# Patient Record
Sex: Female | Born: 1965 | Race: Black or African American | Hispanic: No | Marital: Single | Smoking: Current every day smoker
Health system: Southern US, Community
[De-identification: ages and names within clinical notes are randomized; demographics above are authoritative.]

## PROBLEM LIST (undated history)

## (undated) DIAGNOSIS — J45909 Unspecified asthma, uncomplicated: Secondary | ICD-10-CM

## (undated) DIAGNOSIS — I639 Cerebral infarction, unspecified: Secondary | ICD-10-CM

## (undated) DIAGNOSIS — I10 Essential (primary) hypertension: Secondary | ICD-10-CM

## (undated) DIAGNOSIS — E119 Type 2 diabetes mellitus without complications: Secondary | ICD-10-CM

## (undated) HISTORY — PX: ABDOMINAL HYSTERECTOMY: SHX81

---

## 2016-06-02 ENCOUNTER — Emergency Department (HOSPITAL_BASED_OUTPATIENT_CLINIC_OR_DEPARTMENT_OTHER): Payer: Self-pay

## 2016-06-02 ENCOUNTER — Encounter (HOSPITAL_BASED_OUTPATIENT_CLINIC_OR_DEPARTMENT_OTHER): Payer: Self-pay | Admitting: Emergency Medicine

## 2016-06-02 ENCOUNTER — Emergency Department (HOSPITAL_BASED_OUTPATIENT_CLINIC_OR_DEPARTMENT_OTHER)
Admission: EM | Admit: 2016-06-02 | Discharge: 2016-06-02 | Disposition: A | Discharge status: Home / Self Care | Payer: Self-pay | Attending: Emergency Medicine | Admitting: Emergency Medicine

## 2016-06-02 DIAGNOSIS — Z7984 Long term (current) use of oral hypoglycemic drugs: Secondary | ICD-10-CM | POA: Insufficient documentation

## 2016-06-02 DIAGNOSIS — E119 Type 2 diabetes mellitus without complications: Secondary | ICD-10-CM | POA: Insufficient documentation

## 2016-06-02 DIAGNOSIS — I10 Essential (primary) hypertension: Secondary | ICD-10-CM | POA: Insufficient documentation

## 2016-06-02 DIAGNOSIS — G44209 Tension-type headache, unspecified, not intractable: Secondary | ICD-10-CM | POA: Insufficient documentation

## 2016-06-02 DIAGNOSIS — F17200 Nicotine dependence, unspecified, uncomplicated: Secondary | ICD-10-CM | POA: Insufficient documentation

## 2016-06-02 DIAGNOSIS — J45909 Unspecified asthma, uncomplicated: Secondary | ICD-10-CM | POA: Insufficient documentation

## 2016-06-02 DIAGNOSIS — Z79899 Other long term (current) drug therapy: Secondary | ICD-10-CM | POA: Insufficient documentation

## 2016-06-02 HISTORY — DX: Cerebral infarction, unspecified: I63.9

## 2016-06-02 HISTORY — DX: Type 2 diabetes mellitus without complications: E11.9

## 2016-06-02 HISTORY — DX: Unspecified asthma, uncomplicated: J45.909

## 2016-06-02 HISTORY — DX: Essential (primary) hypertension: I10

## 2016-06-02 LAB — CBC WITH DIFFERENTIAL/PLATELET
Basophils Absolute: 0 10*3/uL (ref 0.0–0.1)
Basophils Relative: 0 %
EOS ABS: 0.1 10*3/uL (ref 0.0–0.7)
EOS PCT: 1 %
HCT: 38.3 % (ref 36.0–46.0)
HEMOGLOBIN: 13.1 g/dL (ref 12.0–15.0)
LYMPHS PCT: 31 %
Lymphs Abs: 1.9 10*3/uL (ref 0.7–4.0)
MCH: 29.7 pg (ref 26.0–34.0)
MCHC: 34.2 g/dL (ref 30.0–36.0)
MCV: 86.8 fL (ref 78.0–100.0)
MONOS PCT: 7 %
Monocytes Absolute: 0.5 10*3/uL (ref 0.1–1.0)
Neutro Abs: 3.7 10*3/uL (ref 1.7–7.7)
Neutrophils Relative %: 61 %
Platelets: 174 10*3/uL (ref 150–400)
RBC: 4.41 MIL/uL (ref 3.87–5.11)
RDW: 12.2 % (ref 11.5–15.5)
WBC: 6.1 10*3/uL (ref 4.0–10.5)

## 2016-06-02 LAB — BASIC METABOLIC PANEL
Anion gap: 6 (ref 5–15)
BUN: 20 mg/dL (ref 6–20)
CO2: 28 mmol/L (ref 22–32)
CREATININE: 0.91 mg/dL (ref 0.44–1.00)
Calcium: 9 mg/dL (ref 8.9–10.3)
Chloride: 104 mmol/L (ref 101–111)
GFR calc Af Amer: >60 mL/min (ref >60)
GFR calc non Af Amer: >60 mL/min (ref >60)
Glucose, Bld: 243 mg/dL — ABNORMAL HIGH (ref 65–99)
POTASSIUM: 3.7 mmol/L (ref 3.5–5.1)
Sodium: 138 mmol/L (ref 135–145)

## 2016-06-02 MED ORDER — KETOROLAC TROMETHAMINE 30 MG/ML IJ SOLN
30.0000 mg | Freq: Once | INTRAMUSCULAR | Status: AC
  Administered 2016-06-02: 30 mg via INTRAVENOUS

## 2016-06-02 MED ORDER — MORPHINE SULFATE (PF) 2 MG/ML IV SOLN
2.0000 mg | Freq: Once | INTRAVENOUS | Status: AC
  Administered 2016-06-02: 2 mg via INTRAVENOUS
  Filled 2016-06-02: qty 1

## 2016-06-02 MED ORDER — KETOROLAC TROMETHAMINE 60 MG/2ML IM SOLN
60.0000 mg | Freq: Once | INTRAMUSCULAR | Status: DC
  Filled 2016-06-02: qty 2

## 2016-06-02 MED ORDER — TRAMADOL HCL 50 MG PO TABS: 50.0000 mg | ORAL_TABLET | Freq: Four times a day (QID) | ORAL | 0 refills | Status: DC | PRN

## 2016-06-02 MED ORDER — IOPAMIDOL (ISOVUE-370) INJECTION 76%
100.0000 mL | Freq: Once | INTRAVENOUS | Status: AC | PRN
  Administered 2016-06-02: 100 mL via INTRAVENOUS

## 2016-06-02 NOTE — ED Provider Notes (Signed)
[facility] Provider Note   CSN: [id number] Arrival date & time: 06/02/16  1449     History   Chief Complaint Chief Complaint  Patient presents with  . Headache    HPI Kayla Gutierrez is a 51 y.o. female.  Patient is a 51 year old female who presents with a headache. 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 She states this was about 2 years ago and resulted in a coil being placed. She states that this morning about 6:00 she woke up and when she was lying in bed noticed a pop to her upper neck and this resulted in a sudden headache to the back of her head. Spin progressively worsening throughout the day. She denies any nausea or vomiting. She does have some photophobia. She has not taken anything for the pain. She has some pain going down her neck but not further down her spine. She denies any numbness or weakness to her extremities. No facial numbness or speech deficits. No vision changes.      Past Medical History:  Diagnosis Date  . Asthma   . Diabetes mellitus without complication (HCC)   . Hypertension   . Stroke [facility](HCC)     There are no active problems to display for this patient.   Past Surgical History:  Procedure Laterality Date  . ABDOMINAL HYSTERECTOMY    . CESAREAN SECTION      OB History    No data available       Home Medications    Prior to Admission medications   Medication Sig Start Date End Date Taking? Authorizing Provider  AMLODIPINE BESYLATE PO Take 10 mg by mouth.   Yes Historical Provider, MD  metFORMIN (GLUMETZA) 1000 MG (MOD) 24 hr tablet Take 1,000 mg by mouth daily with breakfast.   Yes Historical Provider, MD  nitroGLYCERIN (NITROSTAT) 0.4 MG SL tablet Place 0.4 mg under the tongue every 5 (five) minutes as needed for chest pain.   Yes Historical Provider, MD  pantoprazole (PROTONIX) 40 MG tablet Take 40 mg by mouth daily.   Yes Historical Provider, MD  traMADol  (ULTRAM) 50 MG tablet Take 1 tablet (50 mg total) by mouth every 6 (six) hours as needed. 06/02/16   Rolan BuccoMelanie Maelle Sheaffer, MD    Family History History reviewed. No pertinent family history.  Social History Social History  Substance Use Topics  . Smoking status: Current Every Day Smoker  . Smokeless tobacco: Never Used  . Alcohol use No     Allergies   Penicillins   Review of Systems Review of Systems  Constitutional: Negative for chills, diaphoresis, fatigue and fever.  HENT: Negative for congestion, rhinorrhea and sneezing.   Eyes: Negative.   Respiratory: Negative for cough, chest tightness and shortness of breath.   Cardiovascular: Negative for chest pain and leg swelling.  Gastrointestinal: Negative for abdominal pain, blood in stool, diarrhea, nausea and vomiting.  Genitourinary: Negative for difficulty urinating, flank pain, frequency and hematuria.  Musculoskeletal: Positive for neck pain. Negative for arthralgias and back pain.  Skin: Negative for rash.  Neurological: Positive for headaches. Negative for dizziness, speech difficulty, weakness and numbness.     Physical Exam Updated Vital Signs BP 149/95 (BP Location: Left Arm)   Pulse 80   Temp 99.4 F (37.4 C) (Oral)   Resp 20   Ht 5' (1.524 m)   Wt 267 lb (121.1 kg)   SpO2 98%   BMI 52.14 kg/m  Physical Exam  Constitutional: She is oriented to person, place, and time. She appears well-developed and well-nourished.  HENT:  Head: Normocephalic and atraumatic.  Eyes: Pupils are equal, round, and reactive to light.  Neck: Normal range of motion. Neck supple.  She does have some tenderness to the base of the occiput bilaterally  Cardiovascular: Normal rate, regular rhythm and normal heart sounds.   Pulmonary/Chest: Effort normal and breath sounds normal. No respiratory distress. She has no wheezes. She has no rales. She exhibits no tenderness.  Abdominal: Soft. Bowel sounds are normal. There is no tenderness.  There is no rebound and no guarding.  Musculoskeletal: Normal range of motion. She exhibits no edema.  Lymphadenopathy:    She has no cervical adenopathy.  Neurological: She is alert and oriented to person, place, and time.  Motor 5/5 all extremities Sensation grossly intact to LT all extremities Finger to Nose intact, no pronator drift CN II-XII grossly intact    Skin: Skin is warm and dry. No rash noted.  Psychiatric: She has a normal mood and affect.     ED Treatments / Results  Labs (all labs ordered are listed, but only abnormal results are displayed) Labs Reviewed  BASIC METABOLIC PANEL - Abnormal; Notable for the following:       Result Value   Glucose, Bld 243 (*)    All other components within normal limits  CBC WITH DIFFERENTIAL/PLATELET    EKG  EKG Interpretation None       Radiology Ct Angio Head W/cm &/or Wo Cm  Result Date: 06/02/2016 CLINICAL DATA:  Sudden onset of headache. Severe posterior headache, worse when lying flat. Symptoms began this morning. 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 EXAM: CT ANGIOGRAPHY HEAD TECHNIQUE: Multidetector CT imaging of the head was performed using the standard protocol during bolus administration of intravenous contrast. Multiplanar CT image reconstructions and MIPs were obtained to evaluate the vascular anatomy. CONTRAST:  100 mL Isovue 370 COMPARISON:  CT a of the head and neck 12/05/2014 at [facility]. FINDINGS: CT HEAD Brain: Aneurysm coils are again noted. There is expected evolution of the anterior inferior right frontal lobe infarct. No acute infarct, hemorrhage, or mass lesion is present. The basal ganglia are intact. Insular ribbon is normal. The brainstem and cerebellum are within normal limits. Vascular: Right ICA terminus aneurysm coil pack. No significant calcifications or hyperdense vessel. Skull: The calvarium is within normal limits. No focal lytic or blastic  lesions are present. Sinuses: The paranasal sinuses and mastoid air cells are clear. Orbits: The globes and orbits are within normal limits. CTA HEAD Anterior circulation: The internal carotid arteries are within normal limits from the high cervical segments through the ICA termini bilaterally. There is artifact from the coil pack on the right. The A1 and M1 segments are otherwise unremarkable. The MCA bifurcation is intact bilaterally. ACA and MCA branch vessels are within normal limits. Posterior circulation: The left vertebral artery is the dominant vessel. PICA origins are below the field of view. The basilar artery is normal. The posterior cerebral arteries originate from the basilar tip. A prominent right posterior communicating artery is present as well. Moderate attenuation of the proximal right P2 segment is noted. Left-sided PCA branch vessels are unremarkable. Venous sinuses: The dural sinuses are patent. The left transverse sinus is dominant. The straight sinus and deep cerebral veins are intact. Cortical veins are unremarkable. Anatomic variants: Prominent right posterior communicating artery Delayed phase: The postcontrast  images demonstrate no pathologic enhancement. IMPRESSION: 1. No acute infarct or hemorrhage. 2. Expected evolution of the anterior inferior right frontal lobe infarct. 3. Stable right ICA terminus coil pack. 4. 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 Narrowing is better visualized on today's study. The proximal right PCA was obscured by metal artifact on the prior exam. Electronically Signed   By: Marin Roberts M.D.   On: 06/02/2016 17:36    Procedures Procedures (including critical care time)  Medications Ordered in ED Medications  morphine 2 MG/ML injection 2 mg (2 mg Intravenous Given 06/02/16 1621)  iopamidol (ISOVUE-370) 76 % injection 100 mL (100 mLs Intravenous Contrast Given 06/02/16 1659)  ketorolac  (TORADOL) 30 MG/ML injection 30 mg (30 mg Intravenous Given 06/02/16 1758)     Initial Impression / Assessment and Plan / ED Course  I have reviewed the triage vital signs and the nursing notes.  Pertinent labs & imaging results that were available during my care of the patient were reviewed by me and considered in my medical decision making (see chart for details).     Patient presents with headache. Given her history of aneurysm, she did have a CT/CTA which showed no evidence of intracranial hemorrhage. No evidence of aneurysm. No other acute abnormalities. I feel that her pain could be related to musculoskeletal neck pain as she is tender at the base of her occiput. She is neurologically intact. Her symptoms have improved in the ED. She was discharged home in good condition. She was given prescriptions for tramadol. She was encouraged to have close follow-up with her PCP. Return precautions were given.  Final Clinical Impressions(s) / ED Diagnoses   Final diagnoses:  Acute non intractable tension-type headache    New Prescriptions New Prescriptions   TRAMADOL (ULTRAM) 50 MG TABLET    Take 1 tablet (50 mg total) by mouth every 6 (six) hours as needed.     Rolan Bucco, MD 06/02/16 [phone]

## 2016-06-02 NOTE — ED Triage Notes (Signed)
Pt states she woke up this morning around 6am heard a popping sound and started having a HA

## 2016-06-02 NOTE — ED Notes (Signed)
Pt requested something to drink; RN explained NPO status until test results complete.

## 2018-10-15 IMAGING — CT CT ANGIO HEAD
3 of 11 series · 14 of 47 positions shown · IV contrast (APPLIED)
Comparison: CT a of the head and neck 12/05/2014 at [REDACTED].

CLINICAL DATA: Sudden onset of headache. Severe posterior headache,
worse when lying flat. Symptoms began this morning. Personal history
of right ICA terminus aneurysm treated with coiling.

EXAM:
CT ANGIOGRAPHY HEAD
TECHNIQUE: Multidetector CT imaging of the head was performed using the
standard protocol during bolus administration of intravenous
contrast. Multiplanar CT image reconstructions and MIPs were
obtained to evaluate the vascular anatomy.
CONTRAST:  100 mL Isovue 370

[Series 10: ax thin · axial · 0.39mm/px · z∈[-152,-39]mm · 8 of 144 slices shown]
[im 15/144  brain]
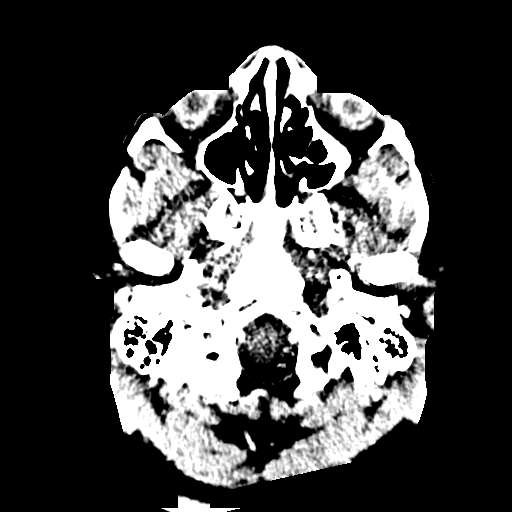
[im 29/144  bone]
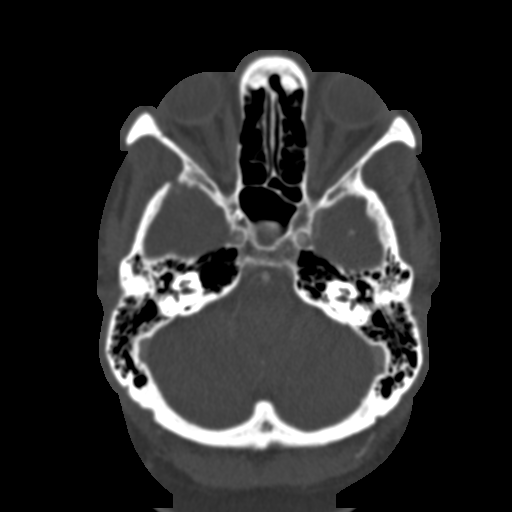
[im 43/144  brain]
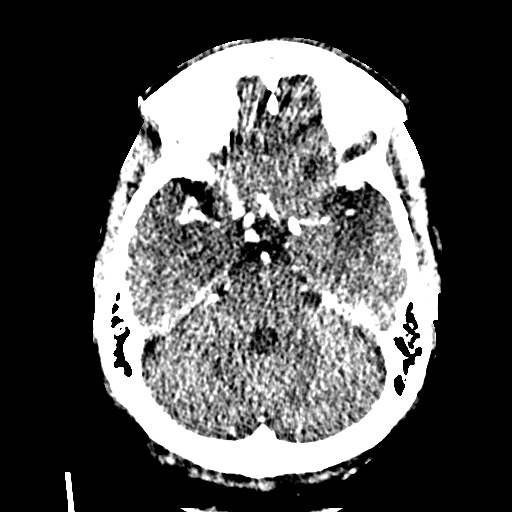
[im 58/144  bone]
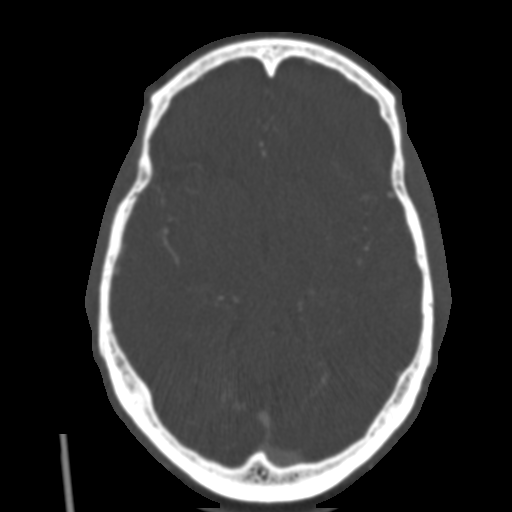
[im 86/144  brain]
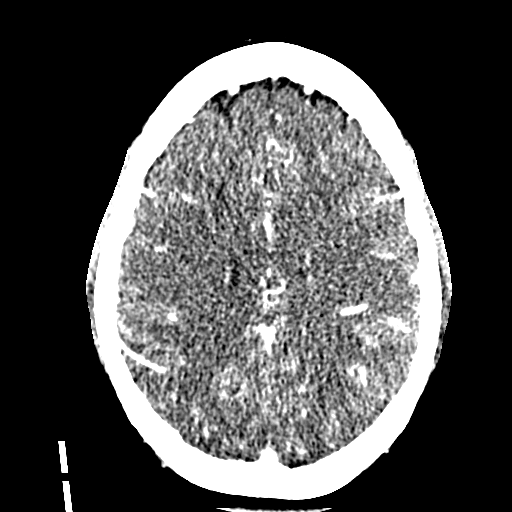
[im 101/144  bone]
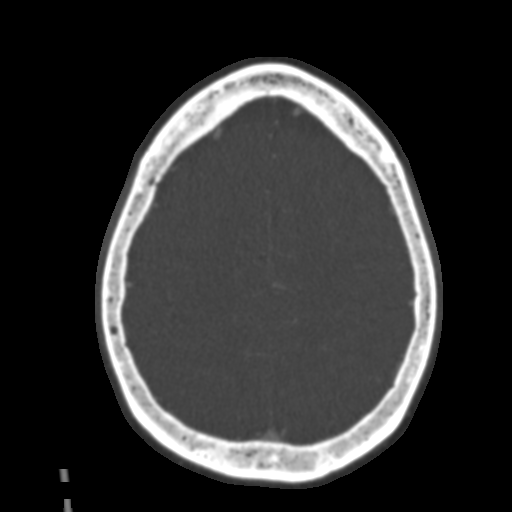
[im 115/144  brain]
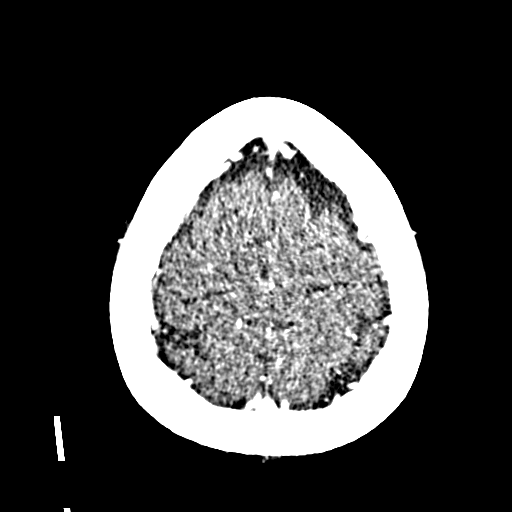
[im 129/144  bone]
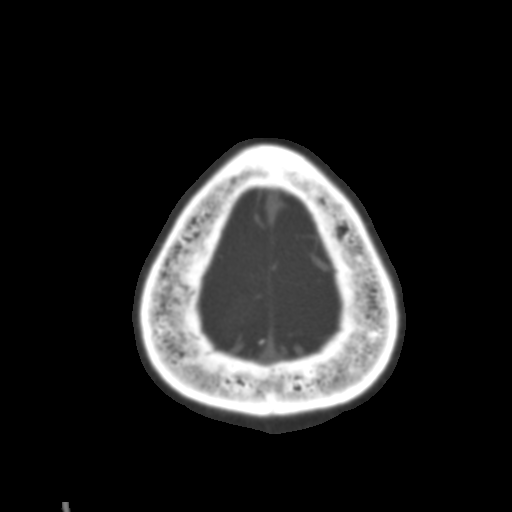

[Series 12: cor thin · coronal · 0.30mm/px · 3 of 195 slices shown]
[im 56/195  brain]
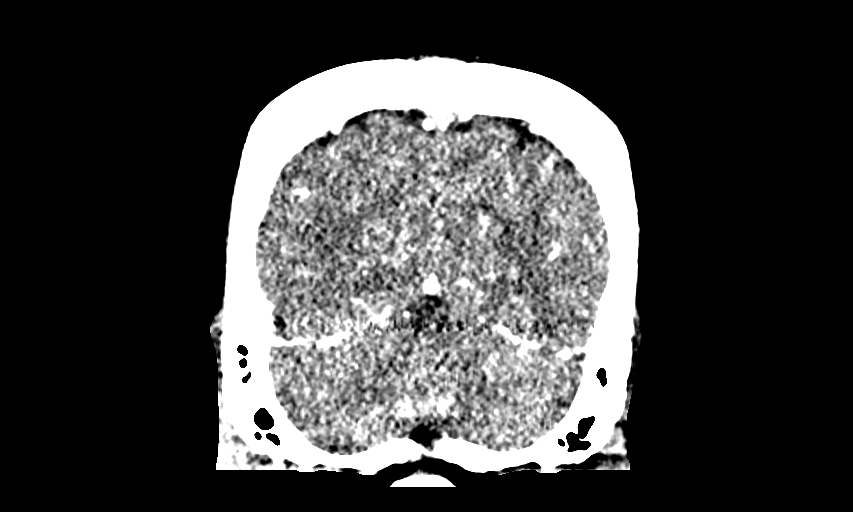
[im 84/195  brain]
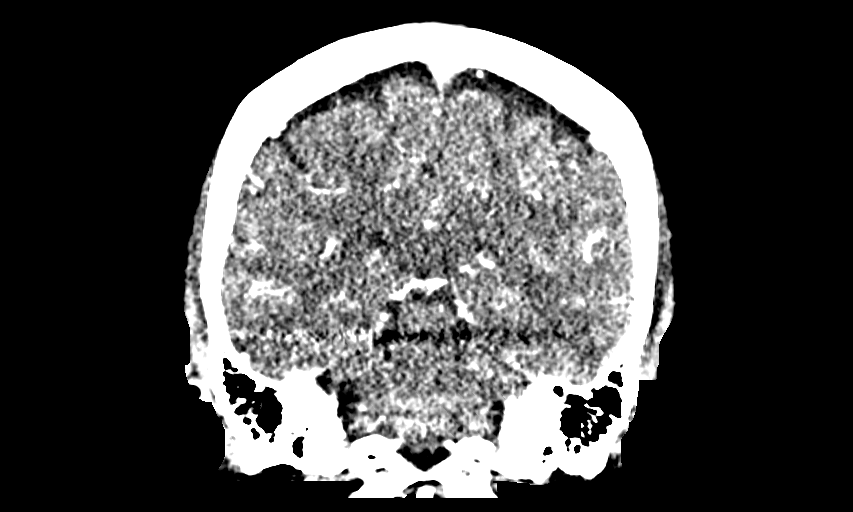
[im 111/195  brain]
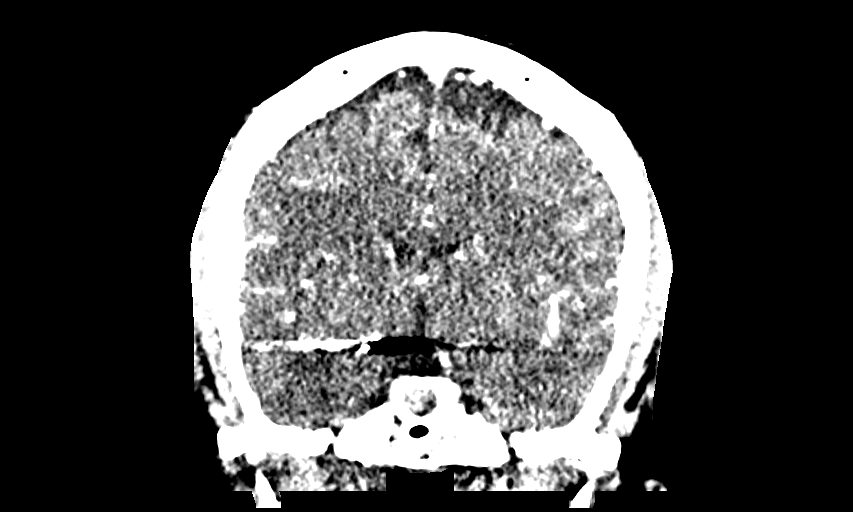

[Series 14: sag thin · sagittal · 0.30mm/px · 3 of 165 slices shown]
[im 33/165  brain]
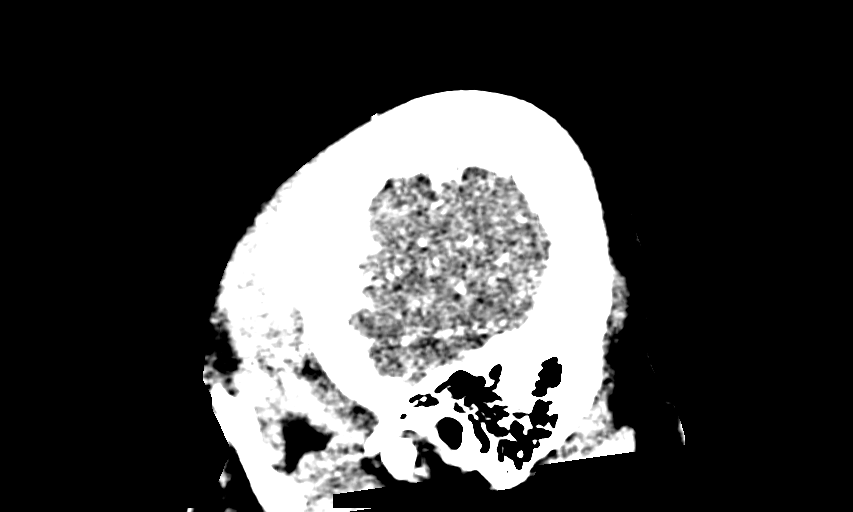
[im 66/165  brain]
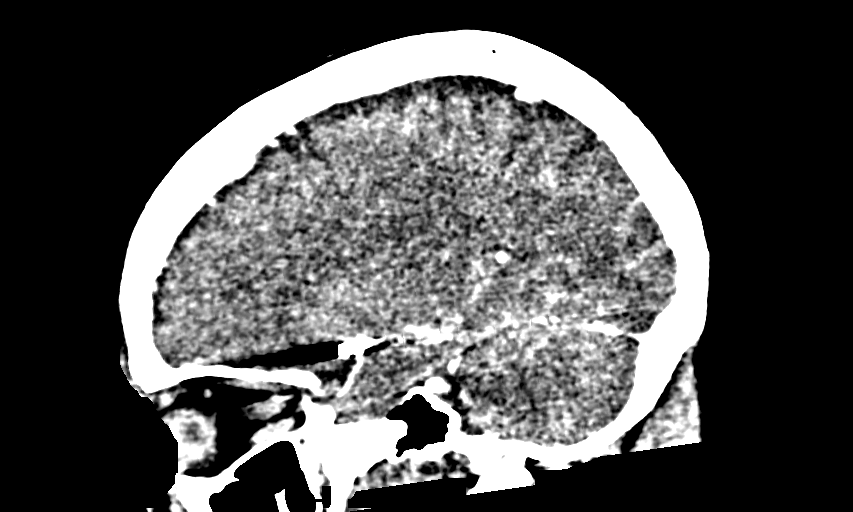
[im 99/165  brain]
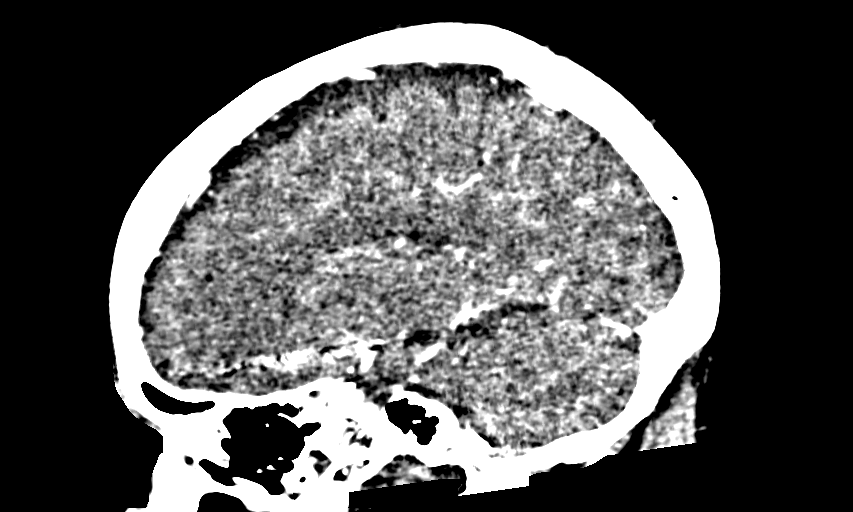

[14 of 47 positions shown; findings below may reference images not displayed]

FINDINGS: CT HEAD

Brain: Aneurysm coils are again noted. There is expected evolution
of the anterior inferior right frontal lobe infarct. No acute
infarct, hemorrhage, or mass lesion is present. The basal ganglia
are intact. Insular ribbon is normal. The brainstem and cerebellum
are within normal limits.

Vascular: Right ICA terminus aneurysm coil pack. No significant
calcifications or hyperdense vessel.

Skull: The calvarium is within normal limits. No focal lytic or
blastic lesions are present.

Sinuses: The paranasal sinuses and mastoid air cells are clear.

Orbits: The globes and orbits are within normal limits.

CTA HEAD

Anterior circulation: The internal carotid arteries are within
normal limits from the high cervical segments through the ICA
termini bilaterally. There is artifact from the coil pack on the
right. The A1 and M1 segments are otherwise unremarkable. The MCA
bifurcation is intact bilaterally. ACA and MCA branch vessels are
within normal limits.

Posterior circulation: The left vertebral artery is the dominant
vessel. PICA origins are below the field of view. The basilar artery
is normal. The posterior cerebral arteries originate from the
basilar tip. A prominent right posterior communicating artery is
present as well. Moderate attenuation of the proximal right P2
segment is noted. Left-sided PCA branch vessels are unremarkable.

Venous sinuses: The dural sinuses are patent. The left transverse
sinus is dominant. The straight sinus and deep cerebral veins are
intact. Cortical veins are unremarkable.

Anatomic variants: Prominent right posterior communicating artery

Delayed phase: The postcontrast images demonstrate no pathologic
enhancement.
IMPRESSION: 1. No acute infarct or hemorrhage.
2. Expected evolution of the anterior inferior right frontal lobe
infarct.
3. Stable right ICA terminus coil pack.
4. Moderate stenosis of the proximal right posterior cerebral artery
is similar to the prior exam. Narrowing is better visualized on
today's study. The proximal right PCA was obscured by metal artifact
on the prior exam.

## 2022-10-18 ENCOUNTER — Encounter (HOSPITAL_BASED_OUTPATIENT_CLINIC_OR_DEPARTMENT_OTHER): Payer: Self-pay | Admitting: Emergency Medicine

## 2022-10-18 ENCOUNTER — Other Ambulatory Visit: Payer: Self-pay

## 2022-10-18 ENCOUNTER — Emergency Department (HOSPITAL_BASED_OUTPATIENT_CLINIC_OR_DEPARTMENT_OTHER)
Admission: EM | Admit: 2022-10-18 | Discharge: 2022-10-18 | Disposition: A | Discharge status: Home / Self Care | Payer: 59 | Attending: Emergency Medicine | Admitting: Emergency Medicine

## 2022-10-18 DIAGNOSIS — M545 Low back pain, unspecified: Secondary | ICD-10-CM | POA: Insufficient documentation

## 2022-10-18 DIAGNOSIS — Z7984 Long term (current) use of oral hypoglycemic drugs: Secondary | ICD-10-CM | POA: Insufficient documentation

## 2022-10-18 MED ORDER — KETOROLAC TROMETHAMINE 15 MG/ML IJ SOLN
15.0000 mg | Freq: Once | INTRAMUSCULAR | Status: AC
  Administered 2022-10-18: 15 mg via INTRAMUSCULAR
  Filled 2022-10-18: qty 1

## 2022-10-18 MED ORDER — LIDOCAINE 5 % EX PTCH
1.0000 | MEDICATED_PATCH | CUTANEOUS | Status: DC
  Administered 2022-10-18: 1 via TRANSDERMAL
  Filled 2022-10-18: qty 1

## 2022-10-18 MED ORDER — LIDOCAINE 5 % EX PTCH: 1.0000 | MEDICATED_PATCH | CUTANEOUS | 0 refills | Status: DC

## 2022-10-18 NOTE — ED Provider Notes (Signed)
Kayla Gutierrez EMERGENCY DEPARTMENT AT [facility] Provider Note   CSN: [id number] Arrival date & time: 10/18/22  1146     History Chief Complaint  Patient presents with   Back Pain    Kayla Gutierrez is a 57 y.o. 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  Patient states she was working with the patient and he began to lose his balance and she tried loaned to the wheelchair and had a twisting motion felt in her lower back.  Since then she has been having left lower back pain.  Pain is nonradiating and she denies bowel and bladder incontinence, weakness or numbness to her lower extremities.   Back Pain      Home Medications Prior to Admission medications   Medication Sig Start Date End Date Taking? Authorizing Provider  lidocaine (LIDODERM) 5 % Place 1 patch onto the skin daily. Remove & Discard patch within 12 hours or as directed by MD 10/18/22  Yes Meredeth Ide, Kaidence Callaway M, PA-C  AMLODIPINE BESYLATE PO Take 10 mg by mouth.    [provider]  metFORMIN (GLUMETZA) 1000 MG (MOD) 24 hr tablet Take 1,000 mg by mouth daily with breakfast.    [provider]  nitroGLYCERIN (NITROSTAT) 0.4 MG SL tablet Place 0.4 mg under the tongue every 5 (five) minutes as needed for chest pain.    [provider]  pantoprazole (PROTONIX) 40 MG tablet Take 40 mg by mouth daily.    [provider]  traMADol (ULTRAM) 50 MG tablet Take 1 tablet (50 mg total) by mouth every 6 (six) hours as needed. 06/02/16   Rolan Bucco, MD      Allergies    Penicillins    Review of Systems   Review of Systems  Musculoskeletal:  Positive for back pain.  All other systems reviewed and are negative.   Physical Exam Updated Vital Signs BP (!) 189/121 (BP Location: Left Wrist)   Pulse 90   Temp 98.1 F (36.7 C) (Oral)   Resp 20   Ht 5' (1.524 m)   Wt  119.3 kg   SpO2 98%   BMI 51.36 kg/m  Physical Exam Vitals and nursing note reviewed.  Constitutional:      Appearance: Normal appearance.  HENT:     Head: Normocephalic and atraumatic.  Eyes:     General:        Right eye: No discharge.        Left eye: No discharge.     Conjunctiva/sclera: Conjunctivae normal.  Pulmonary:     Effort: Pulmonary effort is normal.  Musculoskeletal:     Comments: 5/5 strength to the lower extremities.  Normal sensation to the lower extremities.  There is no midline tenderness of the thoracic or lumbar spine.  There is paralumbar muscular tenderness.  Skin:    General: Skin is warm and dry.     Findings: No rash.  Neurological:     General: No focal deficit present.     Mental Status: She is alert.  Psychiatric:        Mood and Affect: Mood normal.        Behavior: Behavior normal.     ED Results / Procedures / Treatments   Labs (all labs ordered are listed, but only abnormal results are displayed) Labs Reviewed - No data to display  EKG None  Radiology No  results found.  Procedures Procedures    Medications Ordered in ED Medications  lidocaine (LIDODERM) 5 % 1 patch (1 patch Transdermal Patch Applied 10/18/22 1232)  ketorolac (TORADOL) 15 MG/ML injection 15 mg (15 mg Intramuscular Given 10/18/22 1231)    ED Course/ Medical Decision Making/ A&P Clinical Course as of 10/18/22 1255  Thu Oct 18, 2022  1252 On reevaluation, patient states she is feeling better and wishes to go home.  This is reasonable. [CF]    Clinical Course User Index [CF] Teressa Lower, PA-C   {   Click here for ABCD2, HEART and other calculators  Medical Decision Making Kayla Gutierrez is a 57 y.o. female patient who presents to the emergency department today for further evaluation of left lower back pain.  This is likely musculoskeletal spasm.  I have a low suspicion for any cauda equina syndrome or any deep-seated infection.  Patient is diabetic I would  likely hold off on steroids at this time.  I will plan to give her Toradol and a Lidoderm patch and plan to reassess.  I do not feel imaging is warranted at this time.  Patient is feeling better. Strict return precautions given, she is safe for discharge. Will treat conservatively with anti-inflammatories.   Risk Prescription drug management.   ED Diagnoses Final diagnoses:  Acute left-sided low back pain without sciatica    Rx / DC Orders ED Discharge Orders          Ordered    lidocaine (LIDODERM) 5 %  Every 24 hours        10/18/22 1252              Honor Loh [place], [place] 10/18/22 1255    Alvira Monday, MD 10/18/22 2230

## 2022-10-18 NOTE — ED Triage Notes (Signed)
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 Denies any urinary symptoms.

## 2022-10-18 NOTE — Discharge Instructions (Signed)
I would continue taking 600 mg of ibuprofen as needed every 6 hours for back pain.  You can take this up to 7 days.  Have also prescribed you with the lidocaine patches.  If these are helpful you can get them over-the-counter or use the prescription.  Whichever is cheaper.  I would like for you to schedule an appointment with your primary care doctor if you are not feeling any better.  You may return to the emergency department for any worsening symptoms.

## 2023-02-11 ENCOUNTER — Encounter (HOSPITAL_BASED_OUTPATIENT_CLINIC_OR_DEPARTMENT_OTHER): Payer: Self-pay | Admitting: Urology

## 2023-02-11 ENCOUNTER — Emergency Department (HOSPITAL_BASED_OUTPATIENT_CLINIC_OR_DEPARTMENT_OTHER)
Admission: EM | Admit: 2023-02-11 | Discharge: 2023-02-11 | Disposition: A | Discharge status: Home / Self Care | Payer: 59 | Attending: Emergency Medicine | Admitting: Emergency Medicine

## 2023-02-11 DIAGNOSIS — Z794 Long term (current) use of insulin: Secondary | ICD-10-CM | POA: Insufficient documentation

## 2023-02-11 DIAGNOSIS — E1065 Type 1 diabetes mellitus with hyperglycemia: Secondary | ICD-10-CM | POA: Insufficient documentation

## 2023-02-11 DIAGNOSIS — Z20822 Contact with and (suspected) exposure to covid-19: Secondary | ICD-10-CM | POA: Diagnosis not present

## 2023-02-11 DIAGNOSIS — E1165 Type 2 diabetes mellitus with hyperglycemia: Secondary | ICD-10-CM | POA: Diagnosis not present

## 2023-02-11 DIAGNOSIS — I10 Essential (primary) hypertension: Secondary | ICD-10-CM | POA: Diagnosis not present

## 2023-02-11 DIAGNOSIS — R519 Headache, unspecified: Secondary | ICD-10-CM | POA: Diagnosis not present

## 2023-02-11 DIAGNOSIS — Z79899 Other long term (current) drug therapy: Secondary | ICD-10-CM | POA: Insufficient documentation

## 2023-02-11 DIAGNOSIS — Z7984 Long term (current) use of oral hypoglycemic drugs: Secondary | ICD-10-CM | POA: Diagnosis not present

## 2023-02-11 DIAGNOSIS — K047 Periapical abscess without sinus: Secondary | ICD-10-CM | POA: Insufficient documentation

## 2023-02-11 DIAGNOSIS — R739 Hyperglycemia, unspecified: Secondary | ICD-10-CM

## 2023-02-11 LAB — CBC WITH DIFFERENTIAL/PLATELET
Abs Immature Granulocytes: 0.01 10*3/uL (ref 0.00–0.07)
Basophils Absolute: 0 10*3/uL (ref 0.0–0.1)
Basophils Relative: 0 %
Eosinophils Absolute: 0.1 10*3/uL (ref 0.0–0.5)
Eosinophils Relative: 2 %
HCT: 38.4 % (ref 36.0–46.0)
Hemoglobin: 13.3 g/dL (ref 12.0–15.0)
Immature Granulocytes: 0 %
Lymphocytes Relative: 33 %
Lymphs Abs: 1.9 10*3/uL (ref 0.7–4.0)
MCH: 29 pg (ref 26.0–34.0)
MCHC: 34.6 g/dL (ref 30.0–36.0)
MCV: 83.7 fL (ref 80.0–100.0)
Monocytes Absolute: 0.4 10*3/uL (ref 0.1–1.0)
Monocytes Relative: 7 %
Neutro Abs: 3.3 10*3/uL (ref 1.7–7.7)
Neutrophils Relative %: 58 %
Platelets: 205 10*3/uL (ref 150–400)
RBC: 4.59 MIL/uL (ref 3.87–5.11)
RDW: 11.9 % (ref 11.5–15.5)
WBC: 5.8 10*3/uL (ref 4.0–10.5)
nRBC: 0 % (ref 0.0–0.2)

## 2023-02-11 LAB — COMPREHENSIVE METABOLIC PANEL
ALT: 16 U/L (ref 0–44)
AST: 23 U/L (ref 15–41)
Albumin: 3.2 g/dL — ABNORMAL LOW (ref 3.5–5.0)
Alkaline Phosphatase: 119 U/L (ref 38–126)
Anion gap: 9 (ref 5–15)
BUN: 15 mg/dL (ref 6–20)
CO2: 28 mmol/L (ref 22–32)
Calcium: 8.5 mg/dL — ABNORMAL LOW (ref 8.9–10.3)
Chloride: 93 mmol/L — ABNORMAL LOW (ref 98–111)
Creatinine, Ser: 1.24 mg/dL — ABNORMAL HIGH (ref 0.44–1.00)
GFR, Estimated: 51 mL/min — ABNORMAL LOW (ref >60)
Glucose, Bld: 606 mg/dL (ref 70–99)
Potassium: 3.8 mmol/L (ref 3.5–5.1)
Sodium: 130 mmol/L — ABNORMAL LOW (ref 135–145)
Total Bilirubin: 1 mg/dL (ref <1.2)
Total Protein: 7.2 g/dL (ref 6.5–8.1)

## 2023-02-11 LAB — URINALYSIS, ROUTINE W REFLEX MICROSCOPIC
Bilirubin Urine: NEGATIVE
Glucose, UA: >=500 mg/dL — AB
Hgb urine dipstick: NEGATIVE
Ketones, ur: NEGATIVE mg/dL
Leukocytes,Ua: NEGATIVE
Nitrite: NEGATIVE
Protein, ur: NEGATIVE mg/dL
Specific Gravity, Urine: <=1.005 (ref 1.005–1.030)
pH: 5.5 (ref 5.0–8.0)

## 2023-02-11 LAB — RESP PANEL BY RT-PCR (RSV, FLU A&B, COVID)  RVPGX2
Influenza A by PCR: NEGATIVE
Influenza B by PCR: NEGATIVE
Resp Syncytial Virus by PCR: NEGATIVE
SARS Coronavirus 2 by RT PCR: NEGATIVE

## 2023-02-11 LAB — CBG MONITORING, ED
Glucose-Capillary: >600 mg/dL (ref 70–99)
Glucose-Capillary: 536 mg/dL (ref 70–99)
Glucose-Capillary: 363 mg/dL — ABNORMAL HIGH (ref 70–99)

## 2023-02-11 LAB — URINALYSIS, MICROSCOPIC (REFLEX): Bacteria, UA: NONE SEEN

## 2023-02-11 LAB — TROPONIN I (HIGH SENSITIVITY): Troponin I (High Sensitivity): 4 ng/L (ref <18)

## 2023-02-11 MED ORDER — SODIUM CHLORIDE 0.9 % IV BOLUS
1000.0000 mL | Freq: Once | INTRAVENOUS | Status: AC
  Administered 2023-02-11: 1000 mL via INTRAVENOUS

## 2023-02-11 MED ORDER — AMLODIPINE BESYLATE 5 MG PO TABS
10.0000 mg | ORAL_TABLET | Freq: Once | ORAL | Status: AC
  Administered 2023-02-11: 10 mg via ORAL
  Filled 2023-02-11: qty 2

## 2023-02-11 MED ORDER — HYDROCHLOROTHIAZIDE 25 MG PO TABS
25.0000 mg | ORAL_TABLET | Freq: Once | ORAL | Status: AC
  Administered 2023-02-11: 25 mg via ORAL
  Filled 2023-02-11: qty 1

## 2023-02-11 MED ORDER — AMLODIPINE BESYLATE 10 MG PO TABS: 10.0000 mg | ORAL_TABLET | Freq: Every day | ORAL | 2 refills | Status: DC

## 2023-02-11 MED ORDER — METFORMIN HCL 500 MG PO TABS: 500.0000 mg | ORAL_TABLET | Freq: Two times a day (BID) | ORAL | 2 refills | Status: DC

## 2023-02-11 MED ORDER — INSULIN ASPART 100 UNIT/ML IJ SOLN
10.0000 [IU] | Freq: Once | INTRAMUSCULAR | Status: AC
  Administered 2023-02-11: 10 [IU] via SUBCUTANEOUS

## 2023-02-11 MED ORDER — AMOXICILLIN 500 MG PO CAPS: 500.0000 mg | ORAL_CAPSULE | Freq: Three times a day (TID) | ORAL | 0 refills | Status: DC

## 2023-02-11 MED ORDER — HYDROCHLOROTHIAZIDE 25 MG PO TABS: 25.0000 mg | ORAL_TABLET | Freq: Every day | ORAL | 2 refills | Status: DC

## 2023-02-11 NOTE — ED Notes (Addendum)
error 

## 2023-02-11 NOTE — ED Notes (Signed)
..  The patient is A&OX4, ambulatory at d/c with independent steady gait, NAD. Pt verbalized understanding of d/c instructions, prescriptions and follow up care.  

## 2023-02-11 NOTE — ED Notes (Signed)
CBG >600 in triage, pt hypertensive as well  Provider made aware

## 2023-02-11 NOTE — ED Notes (Addendum)
During blood pressure checks, the normal BP has been on the patient's wrist and very loose, like it is falling off. This RN attempted to reposition the cuff for better accuracy but the patient tenses up and yells out in pain. She reports the cuff gets too tight. I attempted to use large BP cuff to left upper arm but she said it was too tight and demanded this RN to remove the cuff. Then this RN attempted to use normal BP cuff and applied it to her forearm and again she said it was too tight, tensed up and then ripped the cuff off. Then, this RN used the small BP to to her left wrist, pt was able to tolerate that better but she was still very tense. This RN then took the regular sized BP and put it on her right forearm/AC but again became very tense. This RN unsure about the accuracy of the pts BP due to her being tense and also the position of the cuff. Josh, PA informed of my observations and concerns.

## 2023-02-11 NOTE — Discharge Instructions (Addendum)
Please read and follow all provided instructions.  Your diagnoses today include:  1. Hyperglycemia   2. Uncontrolled hypertension   3. Dental infection     Tests performed today include: Complete blood cell count:  Basic metabolic panel: Blood sugar was high but no other complications noted Urinalysis (urine test):  Cardiac enzyme blood test for stress on the heart: Was normal Vital signs. See below for your results today.   Medications prescribed:  Amlodipine and HCTZ for blood pressure  Metformin for high blood sugar  Take any prescribed medications only as directed.  Home care instructions:  Follow any educational materials contained in this packet.  BE VERY CAREFUL not to take multiple medicines containing Tylenol (also called acetaminophen). Doing so can lead to an overdose which can damage your liver and cause liver failure and possibly death.   Follow-up instructions: Please follow-up with your primary care provider in the next 3 days for further evaluation of your symptoms.   Return instructions:  Please return to the Emergency Department if you experience worsening symptoms.  Please return if you have any other emergent concerns.  Additional Information:  Your vital signs today were: BP (!) 183/120   Pulse 79   Temp 97.8 F (36.6 C)   Resp 18   Ht 5' (1.524 m)   Wt 119.3 kg   SpO2 98%   BMI 51.37 kg/m  If your blood pressure (BP) was elevated above 135/85 this visit, please have this repeated by your doctor within one month. --------------    Thank you for the opportunity to take care of you in our Emergency Department. You have been diagnosed with high blood pressure, also known as hypertension. This means that the force of blood against the walls of your blood vessels called is too strong. It also means that your heart has to work harder to move the blood. High blood pressure usually has no symptoms, but over time, it can cause serious health problems  such as Heart attack and heart failure Stroke Kidney disease and failure Vision loss With the help from your healthcare provider and some important life style changes, you can manage your blood pressure and protect your health. Please read the instructions provided on hypertension, how to manage it and how to check your blood pressure. Additionally, use the blood pressure log provided to record your blood pressures. Take the blood pressure log with you to your primary care doctor so that they can adjust your blood pressure medications if needed. Please read the instructions on follow-up appointment. Return to the ER or Call 911 right away if you have any of these symptoms: Chest pain or shortness of breath Severe headache Weakness, tingling, or numbness of your face, arms, or legs (especially on 1 side of the body) Sudden change in vision Confusion, trouble speaking, or trouble understanding speech

## 2023-02-11 NOTE — ED Triage Notes (Signed)
Body aches that started Friday, denies fever States face, back, and axilla pain  Vomited Friday and Saturday but now resolved   Ran out of BP meds  Thursday, no pcp at this time to refill

## 2023-02-11 NOTE — ED Provider Notes (Signed)
[facility] [facility] Provider Note   CSN: [id number] Arrival date & time: 02/11/23  1428     History  Chief Complaint  Patient presents with   Generalized Body Aches   Hyperglycemia    Kayla Gutierrez is a 57 y.o. female.  Patient with history of hypertension, diabetes on insulin, high cholesterol, stroke --presents to the emergency department for complaints of generalized not feeling well.  Symptoms have been present for about 3 days.  She reports vomiting during this time, but no vomiting yesterday or today.  She has been out of her medications for blood sugar and blood pressure.  She complains of some facial pain and congestion starting today.  She has also had some body pain in her back and right axilla area without swelling noted.  She reports increased thirst and urination.  No dysuria.  No abdominal pains.  She reports difficulties obtaining medication and PCP follow-up.       Home Medications Prior to Admission medications   Medication Sig Start Date End Date Taking? Authorizing Provider  amitriptyline (ELAVIL) 25 MG tablet Take by mouth. 02/11/20  Yes [provider]  amLODipine (NORVASC) 10 MG tablet Take 1 tablet by mouth daily. 08/19/15  Yes [provider]  atorvastatin (LIPITOR) 10 MG tablet Take by mouth. 03/03/15  Yes [provider]  hydrochlorothiazide (HYDRODIURIL) 12.5 MG tablet Take 1 tablet by mouth daily. 03/24/20  Yes [provider]  Insulin Glargine (BASAGLAR KWIKPEN) 100 UNIT/ML Inject into the skin. 06/01/22  Yes [provider]  metFORMIN (GLUCOPHAGE-XR) 750 MG 24 hr tablet Take by mouth. 06/07/22  Yes [provider]  acetaminophen (TYLENOL) 500 MG tablet Take by mouth.    [provider]  AMLODIPINE BESYLATE PO Take 10 mg by mouth.    [provider]  lidocaine (LIDODERM) 5 % Place 1 patch onto the skin daily. Remove & Discard patch within 12 hours or  as directed by MD 10/18/22   Teressa Lower, PA-C  metFORMIN (GLUMETZA) 1000 MG (MOD) 24 hr tablet Take 1,000 mg by mouth daily with breakfast.    [provider]  nitroGLYCERIN (NITROSTAT) 0.4 MG SL tablet Place 0.4 mg under the tongue every 5 (five) minutes as needed for chest pain.    [provider]  pantoprazole (PROTONIX) 40 MG tablet Take 40 mg by mouth daily.    [provider]  traMADol (ULTRAM) 50 MG tablet Take 1 tablet (50 mg total) by mouth every 6 (six) hours as needed. 06/02/16   Rolan Bucco, MD      Allergies    Penicillins    Review of Systems   Review of Systems  Physical Exam Updated Vital Signs BP (!) 203/106 (BP Location: Left Arm)   Pulse 92   Temp 97.8 F (36.6 C)   Resp 18   Ht 5' (1.524 m)   Wt 119.3 kg   SpO2 100%   BMI 51.37 kg/m   Physical Exam Vitals and nursing note reviewed.  Constitutional:      Appearance: She is well-developed. She is not diaphoretic.  HENT:     Head: Normocephalic and atraumatic.     Right Ear: External ear normal.     Left Ear: External ear normal.     Mouth/Throat:     Mouth: Mucous membranes are dry.     Comments: Trace swelling noted over the left mandibular gums and jaw area consistent with dental infection. Eyes:  Conjunctiva/sclera: Conjunctivae normal.  Neck:     Vascular: Normal carotid pulses. No JVD.     Trachea: Trachea normal. No tracheal deviation.  Cardiovascular:     Rate and Rhythm: Normal rate and regular rhythm.     Pulses: No decreased pulses.          Radial pulses are 2+ on the right side and 2+ on the left side.     Heart sounds: Normal heart sounds, S1 normal and S2 normal. No murmur heard. Pulmonary:     Effort: Pulmonary effort is normal. No respiratory distress.     Breath sounds: No wheezing.  Chest:     Chest wall: No tenderness.  Abdominal:     General: Bowel sounds are normal.     Palpations: Abdomen is soft.     Tenderness: There is no abdominal  tenderness. There is no guarding or rebound.  Musculoskeletal:        General: Normal range of motion.     Cervical back: Normal range of motion and neck supple. No muscular tenderness.  Skin:    General: Skin is warm and dry.     Coloration: Skin is not pale.     Comments: No right axillary tenderness or swelling.  No obvious abscess.  Neurological:     Mental Status: She is alert.     ED Results / Procedures / Treatments   Labs (all labs ordered are listed, but only abnormal results are displayed) Labs Reviewed  COMPREHENSIVE METABOLIC PANEL - Abnormal; Notable for the following components:      Result Value   Sodium 130 (*)    Chloride 93 (*)    Glucose, Bld 606 (*)    Creatinine, Ser 1.24 (*)    Calcium 8.5 (*)    Albumin 3.2 (*)    GFR, Estimated 51 (*)    All other components within normal limits  URINALYSIS, ROUTINE W REFLEX MICROSCOPIC - Abnormal; Notable for the following components:   Glucose, UA >=500 (*)    All other components within normal limits  CBG MONITORING, ED - Abnormal; Notable for the following components:   Glucose-Capillary >600 (*)    All other components within normal limits  CBG MONITORING, ED - Abnormal; Notable for the following components:   Glucose-Capillary 536 (*)    All other components within normal limits  CBG MONITORING, ED - Abnormal; Notable for the following components:   Glucose-Capillary 363 (*)    All other components within normal limits  RESP PANEL BY RT-PCR (RSV, FLU A&B, COVID)  RVPGX2  CBC WITH DIFFERENTIAL/PLATELET  URINALYSIS, MICROSCOPIC (REFLEX)  TROPONIN I (HIGH SENSITIVITY)    EKG None  Radiology No results found.  Procedures Procedures    Medications Ordered in ED Medications  sodium chloride 0.9 % bolus 1,000 mL (0 mLs Intravenous Stopped 02/11/23 1621)  insulin aspart (novoLOG) injection 10 Units (10 Units Subcutaneous Given 02/11/23 1508)  amLODipine (NORVASC) tablet 10 mg (10 mg Oral Given 02/11/23  1506)  hydrochlorothiazide (HYDRODIURIL) tablet 25 mg (25 mg Oral Given 02/11/23 1506)  sodium chloride 0.9 % bolus 1,000 mL (1,000 mLs Intravenous New Bag/Given 02/11/23 1621)    ED Course/ Medical Decision Making/ A&P    Patient seen and examined. History obtained directly from patient.   Labs/EKG: Ordered CBC, CMP, UA, EKG, troponin.  Imaging: None ordered  Medications/Fluids: Ordered: IV fluid.   Most recent vital signs reviewed and are as follows: BP (!) 203/106 (BP Location: Left Arm)  Pulse 92   Temp 97.8 F (36.6 C)   Resp 18   Ht 5' (1.524 m)   Wt 119.3 kg   SpO2 100%   BMI 51.37 kg/m   Initial impression: Hyperglycemia, hypertension, vague body aches.  Right axilla does not have signs of infection or abscess.  For this reason, given risk factors, will check troponin and EKG.  6:16 PM Reassessment performed. Patient appears stable.  She has received 2 L IV fluids.  Blood sugar improving.  Labs personally reviewed and interpreted including: CBC unremarkable; CMP with elevated glucose of 66, sodium 130 correcting to normal; 3.8 potassium, creatinine 1.24 with normal BUN; UA without compelling signs of infection or ketones; normal.  Imaging personally visualized and interpreted including:  Reviewed pertinent lab work and imaging with patient at bedside. Questions answered.   Most current vital signs reviewed and are as follows: BP (!) 183/120   Pulse 79   Temp 97.8 F (36.6 C)   Resp 18   Ht 5' (1.524 m)   Wt 119.3 kg   SpO2 98%   BMI 51.37 kg/m   Plan: Discharge to home.   Prescriptions written for: Amlodipine, HCTZ, metformin, amoxicillin  Other home care instructions discussed: Continue monitoring of blood sugars, maintain good hydration.  ED return instructions discussed: Return with new or worsening symptoms  Follow-up instructions discussed: Patient encouraged to follow-up with their PCP in 3 days.   I have placed [facility] and the [facility] care  management referrals for follow-up on patient's chronic conditions.                                   Medical Decision Making Amount and/or Complexity of Data Reviewed Labs: ordered.  Risk OTC drugs. Prescription drug management.   Hyperglycemia: No evidence of DKA, did not medication noncompliance.  Possibly exacerbated by minor dental infection.  Patient improved in ED with IV fluids.  Subcutaneous insulin.  TOC consult requested as patient does not have reliable PCP follow-up and access to medications.  Uncontrolled hypertension: Exacerbated by medication noncompliance and not having her medications.  No evidence of endorgan damage today.  No sign of stroke, vision changes, heart failure, dissection, significant acute kidney injury.  Minor dental infection: Patient has pain and left lower jaw swelling.  Placed on amoxicillin.        Final Clinical Impression(s) / ED Diagnoses Final diagnoses:  Hyperglycemia  Uncontrolled hypertension  Dental infection    Rx / DC Orders ED Discharge Orders          Ordered    amLODipine (NORVASC) 10 MG tablet  Daily        02/11/23 1813    hydrochlorothiazide (HYDRODIURIL) 25 MG tablet  Daily        02/11/23 1813    metFORMIN (GLUCOPHAGE) 500 MG tablet  2 times daily with meals        02/11/23 1813    Referral to [facility] Care Management       Comments: Pharmacy Medication Management for Uncontrolled hypertension in the ED   02/11/23 1814    amoxicillin (AMOXIL) 500 MG capsule  3 times daily        02/11/23 1817              Renne Crigler, PA-C 02/11/23 1821    Sloan Leiter, DO 02/13/23 [phone]

## 2023-02-12 ENCOUNTER — Telehealth: Payer: Self-pay

## 2023-02-12 NOTE — Progress Notes (Unsigned)
   Care Guide Note  02/12/2023 Name: Logen Babiak MRN: [id number] DOB: 11-Mar-1966  Referred by: Pcp, No Reason for referral : Care Coordination (Outreach to schedule with Pharm d )   Anniemae Lauff is a 57 y.o. year old female who is a primary care patient of Pcp, No. Harlo Ervine was referred to the pharmacist for assistance related to HTN.    An unsuccessful telephone outreach was attempted today to contact the patient who was referred to the pharmacy team for assistance with medication management. Additional attempts will be made to contact the patient.   Penne Lash , RMA     [facility]  [facility], [facility] Guide  Direct Dial: [phone]  Website: [email or web address]

## 2023-02-14 NOTE — Progress Notes (Signed)
   Care Guide Note  02/14/2023 Name: Kayla Gutierrez MRN: [id number] DOB: 06-02-1965  Referred by: Pcp, No Reason for referral : Care Coordination (Outreach to schedule with Pharm d )   Kayla Gutierrez is a 57 y.o. year old female who is a primary care patient of Pcp, No. Alline Rotan was referred to the pharmacist for assistance related to HTN.    Successful contact was made with the patient to discuss pharmacy services including being ready for the pharmacist to call at least 5 minutes before the scheduled appointment time, to have medication bottles and any blood sugar or blood pressure readings ready for review. The patient agreed to meet with the pharmacist via with the pharmacist via telephone visit on (date/time).  03/04/2023  Penne Lash , RMA     [facility]  [facility], [facility] Guide  Direct Dial: [phone]  Website: [email or web address]

## 2023-03-04 ENCOUNTER — Other Ambulatory Visit: Payer: Self-pay | Admitting: Pharmacist

## 2023-03-04 NOTE — Progress Notes (Signed)
03/04/2023 Name: Kayla Gutierrez MRN: [id number] DOB: February 12, 1966  Chief Complaint  Patient presents with   Hypertension    Nabihah Marolda is a 57 y.o. year old female who presented for a telephone visit.   They were referred to the pharmacist by  ED provider  for assistance in managing hypertension.    Subjective:  Care Team: Primary Care Provider: Pcp, No ; Next Scheduled Visit: None Clinical Pharmacist: Marlowe Aschoff, PharmD  Medication Access/Adherence  Current Pharmacy:  [facility] [phone] - [place], [place] - [address] AT [facility] [address] [place] [address] Phone: [phone] Fax: [phone]   Patient reports affordability concerns with their medications: No  Patient reports access/transportation concerns to their pharmacy: No  Patient reports adherence concerns with their medications:  No     Hypertension:  Current medications: Amlodipine 10mg  daily, Hydrochlorothiazide 25mg  daily Medications previously tried:   Patient has a validated, automated, upper arm home BP cuff Current blood pressure readings readings: 152/120 with medications  Patient denies hypotensive s/sx including dizziness, lightheadedness.  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 Sig Documenting Provider Last Dose Status Informant  acetaminophen (TYLENOL) 500 MG tablet [id number] No Take by mouth.  Patient not taking: Reported on 03/04/2023   [provider] Not Taking Active   amitriptyline (ELAVIL) 25 MG tablet [id number] No Take by mouth.  Patient not taking: Reported on 03/04/2023   [provider] Not Taking Active   amLODipine (NORVASC) 10 MG tablet [id number] Yes Take 1 tablet (10 mg total) by mouth daily. Renne Crigler, PA-C Taking Active   amoxicillin (AMOXIL) 500 MG capsule [id number] Yes Take 1 capsule (500 mg total) by mouth 3 (three) times daily. Renne Crigler, PA-C Taking Active   atorvastatin (LIPITOR) 10 MG tablet [id number] No Take by mouth.  Patient not taking: Reported on 03/04/2023   [provider] Not Taking Active   hydrochlorothiazide (HYDRODIURIL) 25 MG tablet [id number] Yes Take 1 tablet (25 mg total) by mouth daily. Renne Crigler, PA-C Taking Active   Insulin Glargine (BASAGLAR KWIKPEN) 100 UNIT/ML [id number] No Inject into the skin.  Patient not taking: Reported on 03/04/2023   [provider] Not Taking Active   lidocaine (LIDODERM) 5 % [id number] No Place 1 patch onto the skin daily. Remove & Discard patch within 12 hours or as directed by MD  Patient not taking: Reported on 03/04/2023   Teressa Lower, PA-C Not Taking Active   metFORMIN (GLUCOPHAGE) 500 MG tablet [id number] Yes Take 1 tablet (500 mg total) by mouth 2 (two) times daily with a meal. Renne Crigler, PA-C Taking Active   nitroGLYCERIN (NITROSTAT) 0.4 MG SL tablet [id number] No Place  0.4 mg under the tongue every 5 (five) minutes as needed for chest pain.  Patient not taking: Reported on 03/04/2023   [provider] Not Taking Active   pantoprazole (PROTONIX) 40 MG tablet [id number] No Take 40 mg by mouth daily.  Patient not taking: Reported on 03/04/2023   [provider] Not Taking Active   traMADol  (ULTRAM) 50 MG tablet [id number] No Take 1 tablet (50 mg total) by mouth every 6 (six) hours as needed.  Patient not taking: Reported on 03/04/2023   Rolan Bucco, MD Not Taking Active               Assessment/Plan:   Hypertension: - Currently uncontrolled - Reviewed long term cardiovascular and renal outcomes of uncontrolled blood pressure - Reviewed appropriate blood pressure monitoring technique and reviewed goal blood pressure. 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

## 2023-03-14 ENCOUNTER — Encounter: Payer: Self-pay | Admitting: Family

## 2023-03-14 ENCOUNTER — Ambulatory Visit (INDEPENDENT_AMBULATORY_CARE_PROVIDER_SITE_OTHER): Payer: 59 | Admitting: Family

## 2023-03-14 VITALS — BP 148/102 | HR 94 | Ht 60.0 in | Wt 281.2 lb

## 2023-03-14 DIAGNOSIS — Z1231 Encounter for screening mammogram for malignant neoplasm of breast: Secondary | ICD-10-CM

## 2023-03-14 DIAGNOSIS — I10 Essential (primary) hypertension: Secondary | ICD-10-CM | POA: Diagnosis not present

## 2023-03-14 DIAGNOSIS — H538 Other visual disturbances: Secondary | ICD-10-CM | POA: Diagnosis not present

## 2023-03-14 DIAGNOSIS — E1169 Type 2 diabetes mellitus with other specified complication: Secondary | ICD-10-CM

## 2023-03-14 DIAGNOSIS — R42 Dizziness and giddiness: Secondary | ICD-10-CM

## 2023-03-14 DIAGNOSIS — Z7984 Long term (current) use of oral hypoglycemic drugs: Secondary | ICD-10-CM

## 2023-03-14 DIAGNOSIS — R0789 Other chest pain: Secondary | ICD-10-CM | POA: Diagnosis not present

## 2023-03-14 MED ORDER — HYDROCHLOROTHIAZIDE 25 MG PO TABS: 25.0000 mg | ORAL_TABLET | Freq: Every day | ORAL | 1 refills | Status: AC

## 2023-03-14 MED ORDER — AMLODIPINE BESYLATE 10 MG PO TABS: 10.0000 mg | ORAL_TABLET | Freq: Every day | ORAL | 1 refills | Status: AC

## 2023-03-14 MED ORDER — VALSARTAN 160 MG PO TABS: 160.0000 mg | ORAL_TABLET | Freq: Every day | ORAL | 0 refills | Status: DC

## 2023-03-14 NOTE — Patient Instructions (Signed)
Please take Amlodipine and hydrochlorothiazide in the morning ( which you have been doing); take the Valsartan with your evening Metformin.

## 2023-03-14 NOTE — Progress Notes (Signed)
Kayla Gutierrez is a 57 y.o. female with the following history as recorded in EpicCare:  There are no active problems to display for this patient.   Current Outpatient Medications  Medication Sig Dispense Refill   metFORMIN (GLUCOPHAGE) 500 MG tablet Take 1 tablet (500 mg total) by mouth 2 (two) times daily with a meal. 60 tablet 2   valsartan (DIOVAN) 160 MG tablet Take 1 tablet (160 mg total) by mouth daily. 90 tablet 0   amLODipine (NORVASC) 10 MG tablet Take 1 tablet (10 mg total) by mouth daily. 90 tablet 1   atorvastatin (LIPITOR) 10 MG tablet Take by mouth. (Patient not taking: Reported on 03/14/2023)     hydrochlorothiazide (HYDRODIURIL) 25 MG tablet Take 1 tablet (25 mg total) by mouth daily. 90 tablet 1   Insulin Glargine (BASAGLAR KWIKPEN) 100 UNIT/ML Inject into the skin. (Patient not taking: Reported on 03/14/2023)     No current facility-administered medications for this visit.    Allergies: Penicillins  Past Medical History:  Diagnosis Date   Asthma    Diabetes mellitus without complication (HCC)    Hypertension    Stroke [facility])     Past Surgical History:  Procedure Laterality Date   ABDOMINAL HYSTERECTOMY     CESAREAN SECTION      No family history on file.  Social History   Tobacco Use   Smoking status: Former    Types: Cigarettes    Start date: 06/05/2022   Smokeless tobacco: Never  Substance Use Topics   Alcohol use: No    Subjective:   Presents today as a new patient;  History of Type 2 Diabetes- unfortunately has been without insurance for the past year so was unable to take medications as prescribed; currently Metformin 500 mg twice a day; in March, she was actually taking Basaglar 20 units/ night and Humalog 10 units with meals ( tid);   History of HTN-  has not been on blood pressure for the past 2 days;       Objective:  Vitals:   03/14/23 1450  BP: (!) 148/102  Pulse: 94  SpO2: 96%  Weight: 281 lb 3.2 oz (127.6 kg)  Height: 5' (1.524 m)     General: Well developed, well nourished, in no acute distress  Skin : Warm and dry.  Head: Normocephalic and atraumatic  Eyes: Sclera and conjunctiva clear; pupils round and reactive to light; extraocular movements intact  Ears: External normal; canals clear; tympanic membranes normal  Oropharynx: Pink, supple. No suspicious lesions  Neck: Supple without thyromegaly, adenopathy  Lungs: Respirations unlabored; clear to auscultation bilaterally without wheeze, rales, rhonchi  CVS exam: normal rate and regular rhythm.  Neurologic: Alert and oriented; speech intact; face symmetrical; moves all extremities well; CNII-XII intact without focal deficit   Assessment:  1. Primary hypertension   2. Type 2 diabetes mellitus with other specified complication, unspecified whether long term insulin use (HCC)   3. Visit for screening mammogram   4. Blurred vision, bilateral   5. Dizziness   6. Atypical chest pain     Plan:  Uncontrolled; continue Amlodipine, hydrochlorothiazide; add Valsartan 160 mg at bedtime; check CBC, CMP; follow up in 1 month, sooner prn.  Chronically uncontrolled x 10+ years; update Hgba1c today in order to determine how much insulin needs to be started; will most likely need to increase Metformin as well; referral to endocrinology; Check for screening mammogram;  Referral to ophthalmology; Suspect due to uncontrolled blood pressure- in reviewing notes,  she was referred to neurology in the past for similar symptoms and was unable to go; refill updated; EKG shows NSR; update CXR today;   Return in about 4 weeks (around 04/11/2023).  Orders Placed This Encounter  Procedures   MM Digital Screening    Standing Status:   Future    Expiration Date:   03/13/2024    Reason for Exam (SYMPTOM  OR DIAGNOSIS REQUIRED):   screening mammogram    Is the patient pregnant?:   No    Preferred imaging location?:   [facility] [place] [facility]   DG Chest 2 View    Standing Status:   Future     Expiration Date:   03/13/2024    Reason for Exam (SYMPTOM  OR DIAGNOSIS REQUIRED):   atypical chest pain    Is patient pregnant?:   No    Preferred imaging location?:   [facility] [place]   CBC with Differential/Platelet   Comp Met (CMET)   Lipid panel   Hemoglobin A1c   Ambulatory referral to Endocrinology    Referral Priority:   Routine    Referral Type:   Consultation    Referral Reason:   Specialty Services Required    Number of Visits Requested:   1   Ambulatory referral to Ophthalmology    Referral Priority:   Routine    Referral Type:   Consultation    Referral Reason:   Specialty Services Required    Requested Specialty:   Ophthalmology    Number of Visits Requested:   1   Ambulatory referral to Neurology    Referral Priority:   Routine    Referral Type:   Consultation    Referral Reason:   Specialty Services Required    Requested Specialty:   Neurology    Number of Visits Requested:   1   EKG 12-Lead    Requested Prescriptions   Signed Prescriptions Disp Refills   amLODipine (NORVASC) 10 MG tablet 90 tablet 1    Sig: Take 1 tablet (10 mg total) by mouth daily.   hydrochlorothiazide (HYDRODIURIL) 25 MG tablet 90 tablet 1    Sig: Take 1 tablet (25 mg total) by mouth daily.   valsartan (DIOVAN) 160 MG tablet 90 tablet 0    Sig: Take 1 tablet (160 mg total) by mouth daily.

## 2023-03-15 ENCOUNTER — Other Ambulatory Visit: Payer: Self-pay | Admitting: Family

## 2023-03-15 ENCOUNTER — Telehealth (HOSPITAL_BASED_OUTPATIENT_CLINIC_OR_DEPARTMENT_OTHER): Payer: Self-pay | Admitting: Family

## 2023-03-15 ENCOUNTER — Encounter: Payer: Self-pay | Admitting: Family

## 2023-03-15 ENCOUNTER — Telehealth: Payer: Self-pay | Admitting: Family

## 2023-03-15 LAB — LIPID PANEL
Cholesterol: 207 mg/dL — ABNORMAL HIGH (ref 0–200)
HDL: 41.1 mg/dL (ref >39.00)
LDL Cholesterol: 103 mg/dL — ABNORMAL HIGH (ref 0–99)
NonHDL: 165.55
Total CHOL/HDL Ratio: 5
Triglycerides: 314 mg/dL — ABNORMAL HIGH (ref 0.0–149.0)
VLDL: 62.8 mg/dL — ABNORMAL HIGH (ref 0.0–40.0)

## 2023-03-15 LAB — COMPREHENSIVE METABOLIC PANEL
ALT: 12 U/L (ref 0–35)
AST: 14 U/L (ref 0–37)
Albumin: 3.9 g/dL (ref 3.5–5.2)
Alkaline Phosphatase: 105 U/L (ref 39–117)
BUN: 19 mg/dL (ref 6–23)
CO2: 29 meq/L (ref 19–32)
Calcium: 9 mg/dL (ref 8.4–10.5)
Chloride: 94 meq/L — ABNORMAL LOW (ref 96–112)
Creatinine, Ser: 1.2 mg/dL (ref 0.40–1.20)
GFR: 50.61 mL/min — ABNORMAL LOW (ref >60.00)
Glucose, Bld: 394 mg/dL — ABNORMAL HIGH (ref 70–99)
Potassium: 3.9 meq/L (ref 3.5–5.1)
Sodium: 133 meq/L — ABNORMAL LOW (ref 135–145)
Total Bilirubin: 0.4 mg/dL (ref 0.2–1.2)
Total Protein: 7.4 g/dL (ref 6.0–8.3)

## 2023-03-15 LAB — CBC WITH DIFFERENTIAL/PLATELET
Basophils Absolute: 0.1 10*3/uL (ref 0.0–0.1)
Basophils Relative: 1.4 % (ref 0.0–3.0)
Eosinophils Absolute: 0.2 10*3/uL (ref 0.0–0.7)
Eosinophils Relative: 2.1 % (ref 0.0–5.0)
HCT: 40.5 % (ref 36.0–46.0)
Hemoglobin: 13.6 g/dL (ref 12.0–15.0)
Lymphocytes Relative: 35.1 % (ref 12.0–46.0)
Lymphs Abs: 2.6 10*3/uL (ref 0.7–4.0)
MCHC: 33.7 g/dL (ref 30.0–36.0)
MCV: 87.1 fL (ref 78.0–100.0)
Monocytes Absolute: 0.5 10*3/uL (ref 0.1–1.0)
Monocytes Relative: 6.6 % (ref 3.0–12.0)
Neutro Abs: 4.1 10*3/uL (ref 1.4–7.7)
Neutrophils Relative %: 54.8 % (ref 43.0–77.0)
Platelets: 238 10*3/uL (ref 150.0–400.0)
RBC: 4.65 Mil/uL (ref 3.87–5.11)
RDW: 12.4 % (ref 11.5–15.5)
WBC: 7.5 10*3/uL (ref 4.0–10.5)

## 2023-03-15 LAB — HEMOGLOBIN A1C: Hgb A1c MFr Bld: 14.3 % — ABNORMAL HIGH (ref 4.6–6.5)

## 2023-03-15 MED ORDER — BASAGLAR KWIKPEN 100 UNIT/ML ~~LOC~~ SOPN: 10.0000 [IU] | PEN_INJECTOR | Freq: Every day | SUBCUTANEOUS | 0 refills | Status: DC

## 2023-03-15 MED ORDER — TRESIBA FLEXTOUCH 100 UNIT/ML ~~LOC~~ SOPN: 10.0000 [IU] | PEN_INJECTOR | Freq: Every day | SUBCUTANEOUS | 0 refills | Status: DC

## 2023-03-15 MED ORDER — METFORMIN HCL 1000 MG PO TABS: 1000.0000 mg | ORAL_TABLET | Freq: Two times a day (BID) | ORAL | 0 refills | Status: DC

## 2023-03-15 MED ORDER — INSULIN DEGLUDEC 100 UNIT/ML ~~LOC~~ SOPN: 10.0000 [IU] | PEN_INJECTOR | Freq: Every day | SUBCUTANEOUS | 0 refills | Status: DC

## 2023-03-15 MED ORDER — INSULIN PEN NEEDLE 31G X 5 MM MISC: 0 refills | Status: DC

## 2023-03-15 MED ORDER — ATORVASTATIN CALCIUM 10 MG PO TABS: 20.0000 mg | ORAL_TABLET | Freq: Every day | ORAL | 3 refills | Status: AC

## 2023-03-15 NOTE — Telephone Encounter (Signed)
Please let her know that her insurance will not cover the Basaglar insulin she has used in the past. I have sent in [place] for her which is a good option as well. Start with 10 units nightly and we will discuss increasing at her next appointment. It is very important that she establish with endocrinology. Her diabetes is very poorly controlled and she would benefit from working with a diabetes specialist.

## 2023-03-15 NOTE — Telephone Encounter (Signed)
Spoke with pt, pt is aware of results and expressed understanding.  

## 2023-03-18 ENCOUNTER — Encounter (HOSPITAL_BASED_OUTPATIENT_CLINIC_OR_DEPARTMENT_OTHER): Payer: Self-pay

## 2023-03-18 ENCOUNTER — Ambulatory Visit (HOSPITAL_BASED_OUTPATIENT_CLINIC_OR_DEPARTMENT_OTHER)
Admission: RE | Admit: 2023-03-18 | Discharge: 2023-03-18 | Disposition: A | Discharge status: Home / Self Care | Payer: 59 | Source: Ambulatory Visit | Attending: Family | Admitting: Family

## 2023-03-18 DIAGNOSIS — Z1231 Encounter for screening mammogram for malignant neoplasm of breast: Secondary | ICD-10-CM | POA: Diagnosis not present

## 2023-03-29 DIAGNOSIS — E1169 Type 2 diabetes mellitus with other specified complication: Secondary | ICD-10-CM | POA: Diagnosis not present

## 2023-03-29 DIAGNOSIS — Z794 Long term (current) use of insulin: Secondary | ICD-10-CM | POA: Diagnosis not present

## 2023-04-03 ENCOUNTER — Other Ambulatory Visit: Payer: Self-pay | Admitting: Family

## 2023-04-03 DIAGNOSIS — Z13220 Encounter for screening for lipoid disorders: Secondary | ICD-10-CM | POA: Diagnosis not present

## 2023-04-03 DIAGNOSIS — E119 Type 2 diabetes mellitus without complications: Secondary | ICD-10-CM | POA: Diagnosis not present

## 2023-04-05 ENCOUNTER — Other Ambulatory Visit: Payer: Self-pay | Admitting: Family

## 2023-04-06 ENCOUNTER — Other Ambulatory Visit: Payer: Self-pay | Admitting: Family

## 2023-04-10 ENCOUNTER — Encounter (HOSPITAL_BASED_OUTPATIENT_CLINIC_OR_DEPARTMENT_OTHER): Payer: Self-pay

## 2023-04-10 ENCOUNTER — Other Ambulatory Visit: Payer: Self-pay

## 2023-04-10 ENCOUNTER — Emergency Department (HOSPITAL_BASED_OUTPATIENT_CLINIC_OR_DEPARTMENT_OTHER)
Admission: EM | Admit: 2023-04-10 | Discharge: 2023-04-10 | Disposition: A | Discharge status: Home / Self Care | Payer: 59 | Attending: Emergency Medicine | Admitting: Emergency Medicine

## 2023-04-10 ENCOUNTER — Emergency Department (HOSPITAL_BASED_OUTPATIENT_CLINIC_OR_DEPARTMENT_OTHER): Payer: 59

## 2023-04-10 DIAGNOSIS — Z794 Long term (current) use of insulin: Secondary | ICD-10-CM | POA: Diagnosis not present

## 2023-04-10 DIAGNOSIS — Z79899 Other long term (current) drug therapy: Secondary | ICD-10-CM | POA: Insufficient documentation

## 2023-04-10 DIAGNOSIS — E119 Type 2 diabetes mellitus without complications: Secondary | ICD-10-CM | POA: Insufficient documentation

## 2023-04-10 DIAGNOSIS — L299 Pruritus, unspecified: Secondary | ICD-10-CM | POA: Diagnosis not present

## 2023-04-10 DIAGNOSIS — K429 Umbilical hernia without obstruction or gangrene: Secondary | ICD-10-CM | POA: Diagnosis not present

## 2023-04-10 DIAGNOSIS — K5730 Diverticulosis of large intestine without perforation or abscess without bleeding: Secondary | ICD-10-CM | POA: Diagnosis not present

## 2023-04-10 DIAGNOSIS — N12 Tubulo-interstitial nephritis, not specified as acute or chronic: Secondary | ICD-10-CM | POA: Insufficient documentation

## 2023-04-10 DIAGNOSIS — Z7984 Long term (current) use of oral hypoglycemic drugs: Secondary | ICD-10-CM | POA: Diagnosis not present

## 2023-04-10 DIAGNOSIS — K760 Fatty (change of) liver, not elsewhere classified: Secondary | ICD-10-CM | POA: Diagnosis not present

## 2023-04-10 DIAGNOSIS — J45909 Unspecified asthma, uncomplicated: Secondary | ICD-10-CM | POA: Insufficient documentation

## 2023-04-10 DIAGNOSIS — R112 Nausea with vomiting, unspecified: Secondary | ICD-10-CM | POA: Diagnosis not present

## 2023-04-10 DIAGNOSIS — I10 Essential (primary) hypertension: Secondary | ICD-10-CM | POA: Diagnosis not present

## 2023-04-10 DIAGNOSIS — K449 Diaphragmatic hernia without obstruction or gangrene: Secondary | ICD-10-CM | POA: Diagnosis not present

## 2023-04-10 LAB — CBC
HCT: 37.1 % (ref 36.0–46.0)
Hemoglobin: 12.5 g/dL (ref 12.0–15.0)
MCH: 28.8 pg (ref 26.0–34.0)
MCHC: 33.7 g/dL (ref 30.0–36.0)
MCV: 85.5 fL (ref 80.0–100.0)
Platelets: 275 10*3/uL (ref 150–400)
RBC: 4.34 MIL/uL (ref 3.87–5.11)
RDW: 11.9 % (ref 11.5–15.5)
WBC: 6.7 10*3/uL (ref 4.0–10.5)
nRBC: 0 % (ref 0.0–0.2)

## 2023-04-10 LAB — URINALYSIS, ROUTINE W REFLEX MICROSCOPIC
Bilirubin Urine: NEGATIVE
Glucose, UA: 100 mg/dL — AB
Ketones, ur: NEGATIVE mg/dL
Nitrite: POSITIVE — AB
Protein, ur: NEGATIVE mg/dL
Specific Gravity, Urine: 1.02 (ref 1.005–1.030)
pH: 5.5 (ref 5.0–8.0)

## 2023-04-10 LAB — COMPREHENSIVE METABOLIC PANEL
ALT: 12 U/L (ref 0–44)
AST: 16 U/L (ref 15–41)
Albumin: 3.4 g/dL — ABNORMAL LOW (ref 3.5–5.0)
Alkaline Phosphatase: 97 U/L (ref 38–126)
Anion gap: 10 (ref 5–15)
BUN: 18 mg/dL (ref 6–20)
CO2: 24 mmol/L (ref 22–32)
Calcium: 8.5 mg/dL — ABNORMAL LOW (ref 8.9–10.3)
Chloride: 99 mmol/L (ref 98–111)
Creatinine, Ser: 1.01 mg/dL — ABNORMAL HIGH (ref 0.44–1.00)
GFR, Estimated: >60 mL/min (ref >60)
Glucose, Bld: 275 mg/dL — ABNORMAL HIGH (ref 70–99)
Potassium: 3.3 mmol/L — ABNORMAL LOW (ref 3.5–5.1)
Sodium: 133 mmol/L — ABNORMAL LOW (ref 135–145)
Total Bilirubin: 0.3 mg/dL (ref 0.0–1.2)
Total Protein: 7.6 g/dL (ref 6.5–8.1)

## 2023-04-10 LAB — LIPASE, BLOOD: Lipase: 39 U/L (ref 11–51)

## 2023-04-10 LAB — URINALYSIS, MICROSCOPIC (REFLEX): WBC, UA: >50 WBC/hpf (ref 0–5)

## 2023-04-10 MED ORDER — SODIUM CHLORIDE 0.9 % IV SOLN
1.0000 g | Freq: Once | INTRAVENOUS | Status: AC
  Administered 2023-04-10: 1 g via INTRAVENOUS
  Filled 2023-04-10: qty 10

## 2023-04-10 MED ORDER — SODIUM CHLORIDE 0.9 % IV BOLUS
1000.0000 mL | Freq: Once | INTRAVENOUS | Status: AC
  Administered 2023-04-10: 1000 mL via INTRAVENOUS

## 2023-04-10 MED ORDER — FLUCONAZOLE 150 MG PO TABS: 150.0000 mg | ORAL_TABLET | Freq: Once | ORAL | 0 refills | Status: AC

## 2023-04-10 MED ORDER — KETOROLAC TROMETHAMINE 30 MG/ML IJ SOLN
30.0000 mg | Freq: Once | INTRAMUSCULAR | Status: AC
  Administered 2023-04-10: 30 mg via INTRAVENOUS
  Filled 2023-04-10: qty 1

## 2023-04-10 MED ORDER — CEPHALEXIN 500 MG PO CAPS: 500.0000 mg | ORAL_CAPSULE | Freq: Three times a day (TID) | ORAL | 0 refills | Status: AC

## 2023-04-10 MED ORDER — IOHEXOL 300 MG/ML  SOLN
125.0000 mL | Freq: Once | INTRAMUSCULAR | Status: AC | PRN
  Administered 2023-04-10: 125 mL via INTRAVENOUS

## 2023-04-10 MED ORDER — HYDROCODONE-ACETAMINOPHEN 5-325 MG PO TABS: 1.0000 | ORAL_TABLET | ORAL | 0 refills | Status: AC | PRN

## 2023-04-10 NOTE — ED Triage Notes (Signed)
 Pt complaint of lower back pain and feeling weak. States she has been dizzy and vomiting. States it started at 0430 today.

## 2023-04-10 NOTE — ED Notes (Signed)
 Patient transported to CT

## 2023-04-10 NOTE — ED Provider Notes (Signed)
 [place] [facility] Provider Note   CSN: [id number] Arrival date & time: 04/10/23  1625     History  Chief Complaint  Patient presents with   Vomiting    Kayla Gutierrez is a 58 y.o. female.  Pt is a 58 yo female with pmhx significant for cva, htn, dm, and asthma.  Pt said she woke up this am with back pain and feeling weak.  She felt dizzy and nauseous.  She does have some dysuria and some itching.  Pt denies any injury.  BP and BS elevated at home.       Home Medications Prior to Admission medications   Medication Sig Start Date End Date Taking? Authorizing Provider  cephALEXin  (KEFLEX ) 500 MG capsule Take 1 capsule (500 mg total) by mouth 3 (three) times daily. 04/10/23  Yes Sueellen Emery, MD  fluconazole  (DIFLUCAN ) 150 MG tablet Take 1 tablet (150 mg total) by mouth once for 1 dose. 04/10/23 04/10/23 Yes Sueellen Emery, MD  HYDROcodone -acetaminophen  (NORCO/VICODIN) 5-325 MG tablet Take 1 tablet by mouth every 4 (four) hours as needed. 04/10/23  Yes Sueellen Emery, MD  amLODipine  (NORVASC ) 10 MG tablet Take 1 tablet (10 mg total) by mouth daily. 03/14/23   Adra Alanis, FNP  atorvastatin  (LIPITOR) 10 MG tablet Take 2 tablets (20 mg total) by mouth daily. 03/15/23   Adra Alanis, FNP  hydrochlorothiazide  (HYDRODIURIL ) 25 MG tablet Take 1 tablet (25 mg total) by mouth daily. 03/14/23   Adra Alanis, FNP  Insulin  Pen Needle 31G X 5 MM MISC Use daily as directed to inject insulin  03/15/23   Adra Alanis, FNP  metFORMIN  (GLUCOPHAGE ) 1000 MG tablet Take 1 tablet (1,000 mg total) by mouth 2 (two) times daily with a meal. 04/08/23   Adra Alanis, FNP  TRESIBA  FLEXTOUCH 100 UNIT/ML FlexTouch Pen Inject 10 Units into the skin at bedtime. 04/03/23   Adra Alanis, FNP  valsartan  (DIOVAN ) 160 MG tablet TAKE 1 TABLET BY MOUTH EVERY DAY 04/05/23   Adra Alanis, FNP      Allergies     Penicillins    Review of Systems   Review of Systems  Musculoskeletal:  Positive for back pain.  Neurological:  Positive for weakness and headaches.  All other systems reviewed and are negative.   Physical Exam Updated Vital Signs BP (!) 148/97 (BP Location: Right Arm)   Pulse 79   Temp 98.5 F (36.9 C) (Oral)   Resp 18   Ht 5' (1.524 m)   Wt 121.6 kg   SpO2 100%   BMI 52.34 kg/m  Physical Exam Vitals and nursing note reviewed.  Constitutional:      Appearance: Normal appearance.  HENT:     Head: Normocephalic and atraumatic.     Right Ear: External ear normal.     Left Ear: External ear normal.     Nose: Nose normal.     Mouth/Throat:     Mouth: Mucous membranes are dry.     Pharynx: Oropharynx is clear.  Eyes:     Extraocular Movements: Extraocular movements intact.     Conjunctiva/sclera: Conjunctivae normal.     Pupils: Pupils are equal, round, and reactive to light.  Cardiovascular:     Rate and Rhythm: Normal rate and regular rhythm.     Pulses: Normal pulses.     Heart sounds: Normal heart sounds.  Pulmonary:     Effort: Pulmonary effort is normal.  Breath sounds: Normal breath sounds.  Abdominal:     General: Abdomen is flat. Bowel sounds are normal.     Palpations: Abdomen is soft.  Musculoskeletal:        General: Normal range of motion.     Cervical back: Normal range of motion and neck supple.  Skin:    General: Skin is warm.     Capillary Refill: Capillary refill takes less than 2 seconds.  Neurological:     General: No focal deficit present.     Mental Status: She is alert and oriented to person, place, and time.  Psychiatric:        Mood and Affect: Mood normal.        Behavior: Behavior normal.     ED Results / Procedures / Treatments   Labs (all labs ordered are listed, but only abnormal results are displayed) Labs Reviewed  COMPREHENSIVE METABOLIC PANEL - Abnormal; Notable for the following components:      Result Value    Sodium 133 (*)    Potassium 3.3 (*)    Glucose, Bld 275 (*)    Creatinine, Ser 1.01 (*)    Calcium  8.5 (*)    Albumin 3.4 (*)    All other components within normal limits  URINALYSIS, ROUTINE W REFLEX MICROSCOPIC - Abnormal; Notable for the following components:   APPearance CLOUDY (*)    Glucose, UA 100 (*)    Hgb urine dipstick TRACE (*)    Nitrite POSITIVE (*)    Leukocytes,Ua MODERATE (*)    All other components within normal limits  URINALYSIS, MICROSCOPIC (REFLEX) - Abnormal; Notable for the following components:   Bacteria, UA MANY (*)    All other components within normal limits  LIPASE, BLOOD  CBC    EKG None  Radiology CT ABDOMEN PELVIS W CONTRAST Result Date: 04/10/2023 CLINICAL DATA:  Pyelonephritis suspected, complicated history EXAM: CT ABDOMEN AND PELVIS WITH CONTRAST TECHNIQUE: Multidetector CT imaging of the abdomen and pelvis was performed using the standard protocol following bolus administration of intravenous contrast. RADIATION DOSE REDUCTION: This exam was performed according to the departmental dose-optimization program which includes automated exposure control, adjustment of the mA and/or kV according to patient size and/or use of iterative reconstruction technique. CONTRAST:  OMNIPAQUE  IOHEXOL  300 MG/ML  SOLN COMPARISON:  Unenhanced CT 05/29/2022 FINDINGS: Lower chest: Clear lung bases. Hepatobiliary: Diffuse hepatic steatosis. No focal liver abnormality. Unremarkable appearance of the gallbladder. No calcified gallstone. No biliary dilatation. Pancreas: No ductal dilatation or inflammation. Spleen: Normal in size without focal abnormality. Adrenals/Urinary Tract: No adrenal nodule. Heterogeneous left renal enhancement with perinephric stranding. No hydronephrosis. No renal fluid collections. There are no renal calculi. Lobulated right renal contours. No focal renal abnormalities. Partially distended urinary bladder, thick walled with mild fat stranding.  Stomach/Bowel: Small hiatal hernia. Nondistended stomach. No bowel obstruction or inflammation. Moderate colonic stool burden. Sigmoid diverticulosis without diverticulitis. Normal appendix. Vascular/Lymphatic: Mild bi-iliac atherosclerosis. Portal vein is patent. There are small retroperitoneal lymph nodes are likely reactive in this setting. Reproductive: Status post hysterectomy. No adnexal masses. Other: No ascites or free air. Diminutive fat containing umbilical hernia. Musculoskeletal: L5-S1 facet hypertrophy. There are no acute or suspicious osseous abnormalities. IMPRESSION: 1. Heterogeneous left renal enhancement with perinephric stranding, consistent with pyelonephritis. No hydronephrosis or renal abscess. 2. Urinary bladder wall thickening with mild fat stranding, likely cystitis. 3. Hepatic steatosis. 4. Sigmoid diverticulosis without diverticulitis. Electronically Signed   By: Chadwick Colonel M.D.   On:  04/10/2023 23:02    Procedures Procedures    Medications Ordered in ED Medications  sodium chloride  0.9 % bolus 1,000 mL (1,000 mLs Intravenous New Bag/Given 04/10/23 2156)  cefTRIAXone  (ROCEPHIN ) 1 g in sodium chloride  0.9 % 100 mL IVPB (1 g Intravenous New Bag/Given 04/10/23 2200)  ketorolac  (TORADOL ) 30 MG/ML injection 30 mg (30 mg Intravenous Given 04/10/23 2156)  iohexol  (OMNIPAQUE ) 300 MG/ML solution 125 mL (125 mLs Intravenous Contrast Given 04/10/23 2205)    ED Course/ Medical Decision Making/ A&P                                 Medical Decision Making Amount and/or Complexity of Data Reviewed Labs: ordered. Radiology: ordered.  Risk Prescription drug management.   This patient presents to the ED for concern of back pain, this involves an extensive number of treatment options, and is a complaint that carries with it a high risk of complications and morbidity.  The differential diagnosis includes msk, pyelo, uti, electrolyte abn   Co morbidities that complicate the  patient evaluation  va, htn, dm, and asthma.   Additional history obtained:  Additional history obtained from epic chart review External records from outside source obtained and reviewed including husband   Lab Tests:  I Ordered, and personally interpreted labs.  The pertinent results include:  cbc nl, lip nl, cmp with bs elevated at 275, ua + uti, lip nl   Imaging Studies ordered:  I ordered imaging studies including ct abd/pelvis  I independently visualized and interpreted imaging which showed   Heterogeneous left renal enhancement with perinephric stranding,  consistent with pyelonephritis. No hydronephrosis or renal abscess.  2. Urinary bladder wall thickening with mild fat stranding, likely  cystitis.  3. Hepatic steatosis.  4. Sigmoid diverticulosis without diverticulitis.   I agree with the radiologist interpretation   Cardiac Monitoring:  The patient was maintained on a cardiac monitor.  I personally viewed and interpreted the cardiac monitored which showed an underlying rhythm of: nsr   Medicines ordered and prescription drug management:  I ordered medication including toradol , rocephin , ivfs  for sx  Reevaluation of the patient after these medicines showed that the patient improved I have reviewed the patients home medicines and have made adjustments as needed   Test Considered:  ct   Critical Interventions:  abx   Problem List / ED Course:  Pyelonephritis:  IV rocephin  given.  Pt is feeling better.  Pt is stable for d/c.  She knows to return if worse. F/u with pcp. HTN:  bp has improved with pain control   Reevaluation:  After the interventions noted above, I reevaluated the patient and found that they have :improved   Social Determinants of Health:  Lives at home   Dispostion:  After consideration of the diagnostic results and the patients response to treatment, I feel that the patent would benefit from discharge with outpatient f/u.           Final Clinical Impression(s) / ED Diagnoses Final diagnoses:  Pyelonephritis    Rx / DC Orders ED Discharge Orders          Ordered    cephALEXin  (KEFLEX ) 500 MG capsule  3 times daily        04/10/23 2314    fluconazole  (DIFLUCAN ) 150 MG tablet   Once        04/10/23 2314    HYDROcodone -acetaminophen  (NORCO/VICODIN) 5-325 MG tablet  Every 4 hours PRN        04/10/23 2314              Sueellen Emery, MD 04/10/23 2316

## 2023-04-16 ENCOUNTER — Ambulatory Visit: Payer: 59 | Admitting: Family

## 2023-04-16 DIAGNOSIS — E1165 Type 2 diabetes mellitus with hyperglycemia: Secondary | ICD-10-CM | POA: Diagnosis not present

## 2023-04-16 DIAGNOSIS — Z794 Long term (current) use of insulin: Secondary | ICD-10-CM | POA: Diagnosis not present

## 2023-04-16 DIAGNOSIS — E119 Type 2 diabetes mellitus without complications: Secondary | ICD-10-CM | POA: Diagnosis not present

## 2023-04-18 ENCOUNTER — Other Ambulatory Visit: Payer: Self-pay | Admitting: Family

## 2023-04-25 DIAGNOSIS — R002 Palpitations: Secondary | ICD-10-CM | POA: Diagnosis not present

## 2023-04-25 DIAGNOSIS — R072 Precordial pain: Secondary | ICD-10-CM | POA: Diagnosis not present

## 2023-04-25 DIAGNOSIS — R011 Cardiac murmur, unspecified: Secondary | ICD-10-CM | POA: Diagnosis not present

## 2023-04-26 DIAGNOSIS — R002 Palpitations: Secondary | ICD-10-CM | POA: Diagnosis not present

## 2023-05-03 ENCOUNTER — Other Ambulatory Visit: Payer: Self-pay | Admitting: Family

## 2023-05-07 ENCOUNTER — Other Ambulatory Visit: Payer: Self-pay | Admitting: Family

## 2023-05-09 ENCOUNTER — Other Ambulatory Visit: Payer: Self-pay | Admitting: Family

## 2024-01-16 ENCOUNTER — Telehealth: Payer: Self-pay | Admitting: Family

## 2024-01-16 NOTE — Telephone Encounter (Signed)
 Lvm for pt to call and schedule a TOC appt.
